# Patient Record
Sex: Male | Born: 1995 | Race: White | Hispanic: Yes | Smoking: Never smoker
Health system: Southern US, Community
[De-identification: ages and names within clinical notes are randomized; demographics above are authoritative.]

## PROBLEM LIST (undated history)

## (undated) DIAGNOSIS — F809 Developmental disorder of speech and language, unspecified: Secondary | ICD-10-CM

## (undated) DIAGNOSIS — I861 Scrotal varices: Secondary | ICD-10-CM

## (undated) DIAGNOSIS — K029 Dental caries, unspecified: Secondary | ICD-10-CM

## (undated) HISTORY — DX: Dental caries, unspecified: K02.9

## (undated) HISTORY — DX: Developmental disorder of speech and language, unspecified: F80.9

## (undated) HISTORY — DX: Scrotal varices: I86.1

---
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
## 2016-01-11 ENCOUNTER — Emergency Department (HOSPITAL_COMMUNITY)
Admission: EM | Admit: 2016-01-11 | Discharge: 2016-01-11 | Disposition: A | Discharge status: Home / Self Care | Payer: No Typology Code available for payment source | Attending: Emergency Medicine | Admitting: Emergency Medicine

## 2016-01-11 ENCOUNTER — Encounter (HOSPITAL_COMMUNITY): Payer: Self-pay

## 2016-01-11 ENCOUNTER — Emergency Department (HOSPITAL_COMMUNITY): Payer: No Typology Code available for payment source

## 2016-01-11 DIAGNOSIS — Z23 Encounter for immunization: Secondary | ICD-10-CM | POA: Diagnosis not present

## 2016-01-11 DIAGNOSIS — M25512 Pain in left shoulder: Secondary | ICD-10-CM | POA: Diagnosis not present

## 2016-01-11 DIAGNOSIS — S0001XA Abrasion of scalp, initial encounter: Secondary | ICD-10-CM | POA: Diagnosis present

## 2016-01-11 DIAGNOSIS — M25552 Pain in left hip: Secondary | ICD-10-CM | POA: Insufficient documentation

## 2016-01-11 DIAGNOSIS — Y999 Unspecified external cause status: Secondary | ICD-10-CM | POA: Diagnosis not present

## 2016-01-11 DIAGNOSIS — Y939 Activity, unspecified: Secondary | ICD-10-CM | POA: Diagnosis not present

## 2016-01-11 DIAGNOSIS — Y92410 Unspecified street and highway as the place of occurrence of the external cause: Secondary | ICD-10-CM | POA: Insufficient documentation

## 2016-01-11 MED ORDER — TETANUS-DIPHTH-ACELL PERTUSSIS 5-2.5-18.5 LF-MCG/0.5 IM SUSP
0.5000 mL | Freq: Once | INTRAMUSCULAR | Status: AC
  Administered 2016-01-11: 0.5 mL via INTRAMUSCULAR
  Filled 2016-01-11: qty 0.5

## 2016-01-11 MED ORDER — METHOCARBAMOL 500 MG PO TABS: 500.0000 mg | ORAL_TABLET | Freq: Two times a day (BID) | ORAL | 0 refills | Status: AC

## 2016-01-11 MED ORDER — NAPROXEN 500 MG PO TABS: 500.0000 mg | ORAL_TABLET | Freq: Two times a day (BID) | ORAL | 0 refills | Status: AC

## 2016-01-11 NOTE — Discharge Instructions (Signed)
Take the prescribed medication as directed. °Follow-up with your primary care doctor. °Return to the ED for new or worsening symptoms. °

## 2016-01-11 NOTE — ED Notes (Signed)
PA Lisa at the bedside.  

## 2016-01-11 NOTE — ED Notes (Signed)
Pt returned from radiology and placed back on monitor
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
## 2016-01-16 ENCOUNTER — Ambulatory Visit: Payer: Self-pay

## 2016-02-05 IMAGING — MR MR KNEE*L* W/O CM
3 of 6 series · 9 of 40 positions shown · non-contrast
Comparison: None.

CLINICAL DATA: Knee pain, swelling and weakness following
hyperextension injury playing soccer 1 month ago. No previous
relevant surgery. Initial encounter.

EXAM:
MRI OF THE LEFT KNEE WITHOUT CONTRAST
TECHNIQUE: Multiplanar, multisequence MR imaging of the knee was performed. No
intravenous contrast was administered.

[Series 5: T2 fat-sat · coronal · 4.0mm · 0.21mm/px · 3 of 25 slices shown]
[im 4/25]
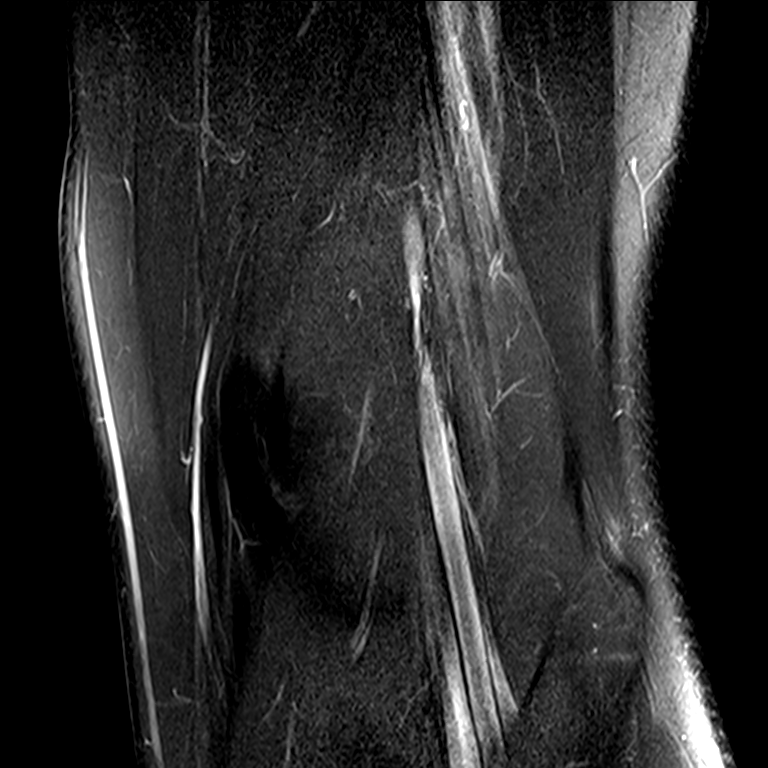
[im 14/25]
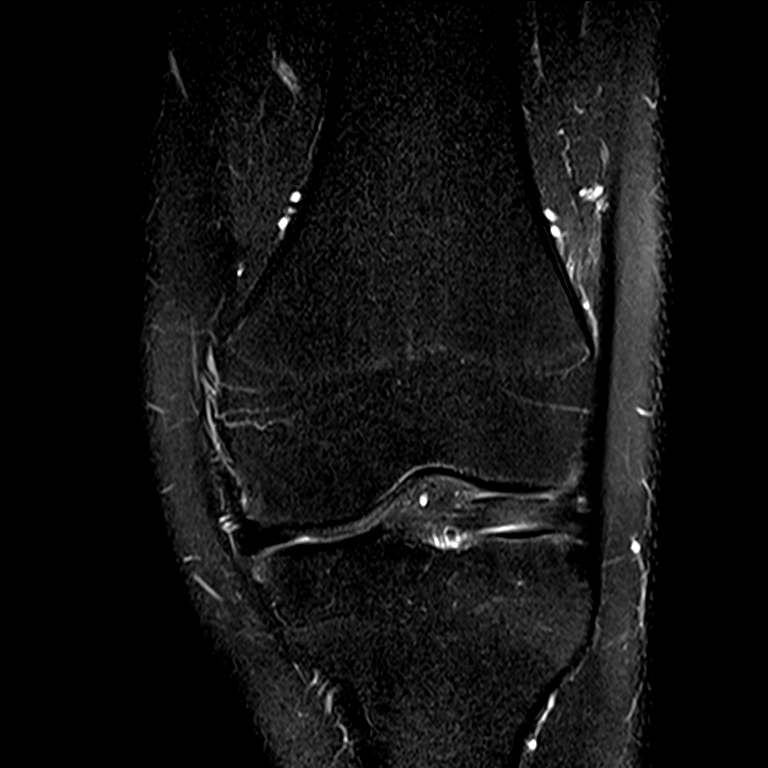
[im 21/25]
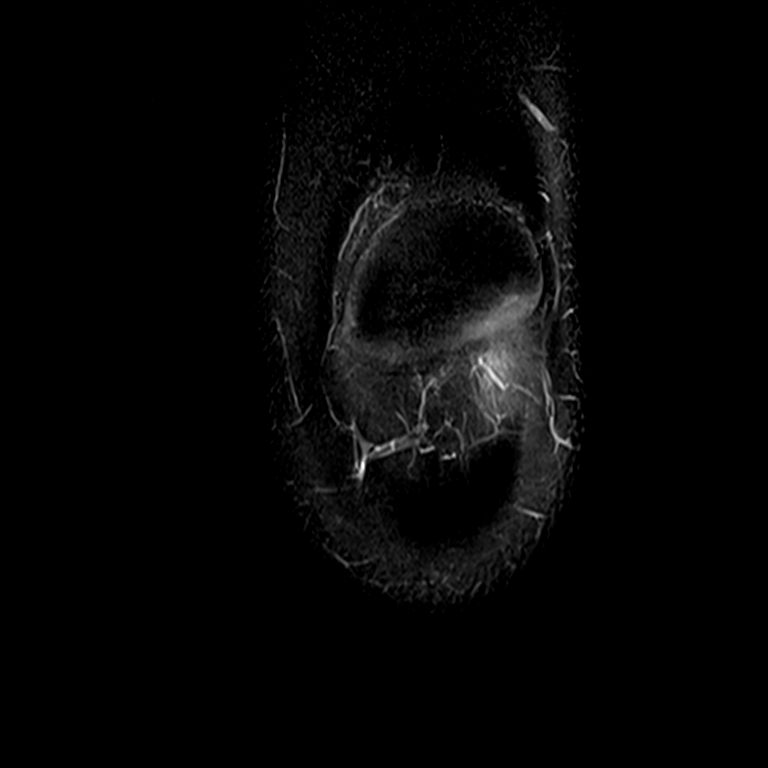

[Series 6: T1 · coronal · 4.0mm · 0.21mm/px · 3 of 23 slices shown]
[im 4/23]
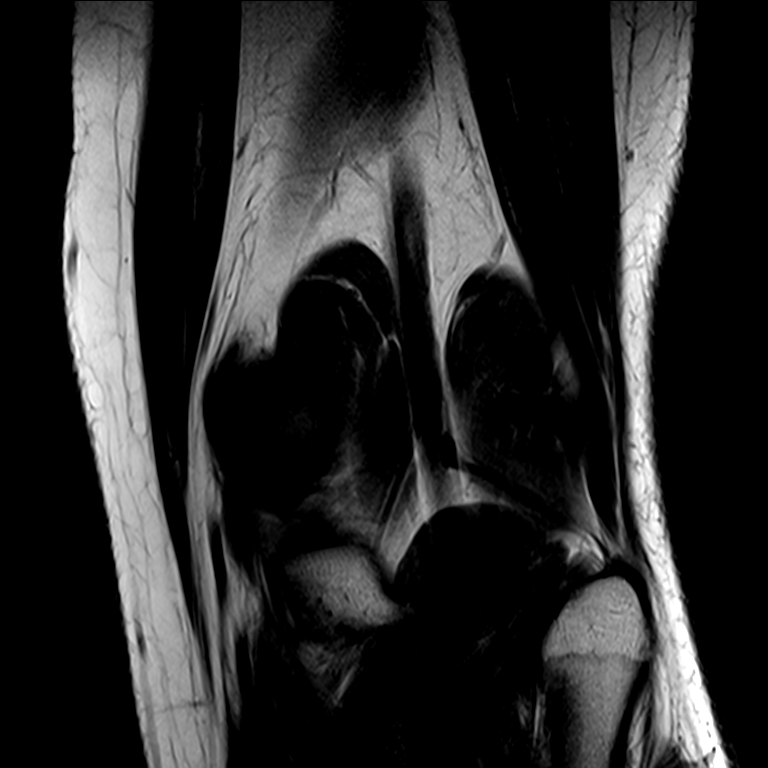
[im 12/23]
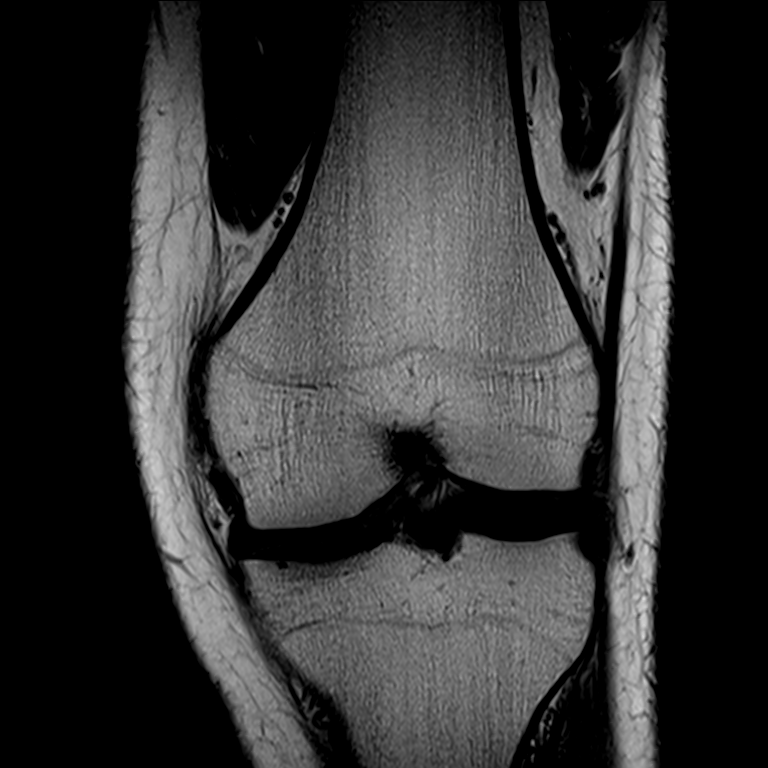
[im 19/23]
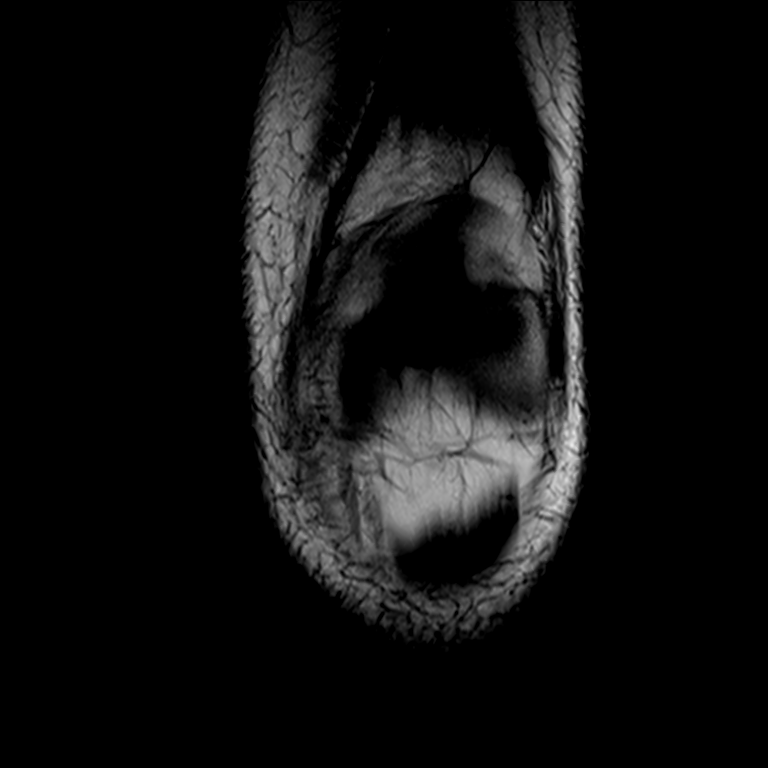

[Series 7: PD fat-sat · sagittal · 4.0mm · 0.21mm/px · 3 of 23 slices shown]
[im 4/23]
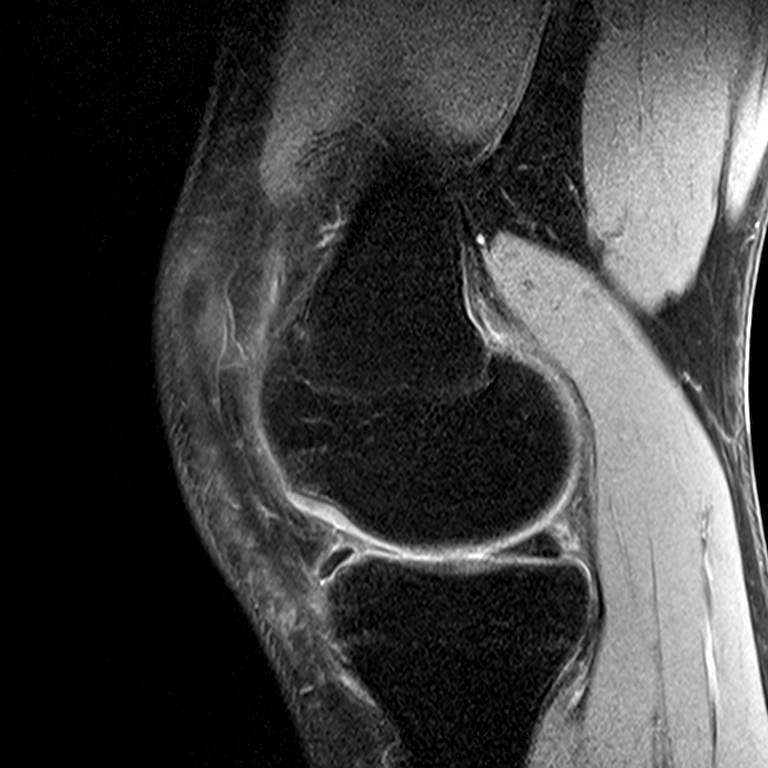
[im 12/23]
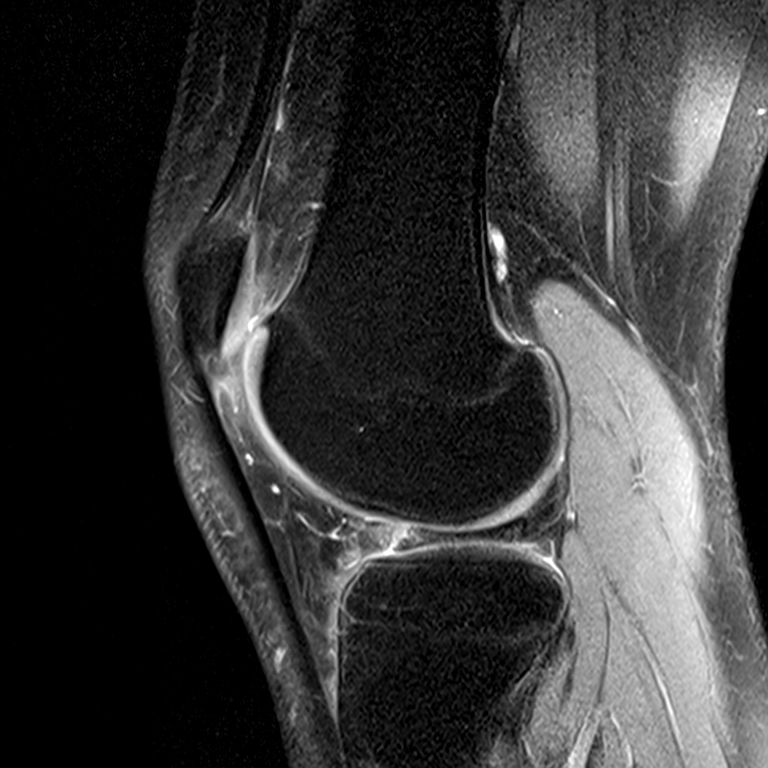
[im 19/23]
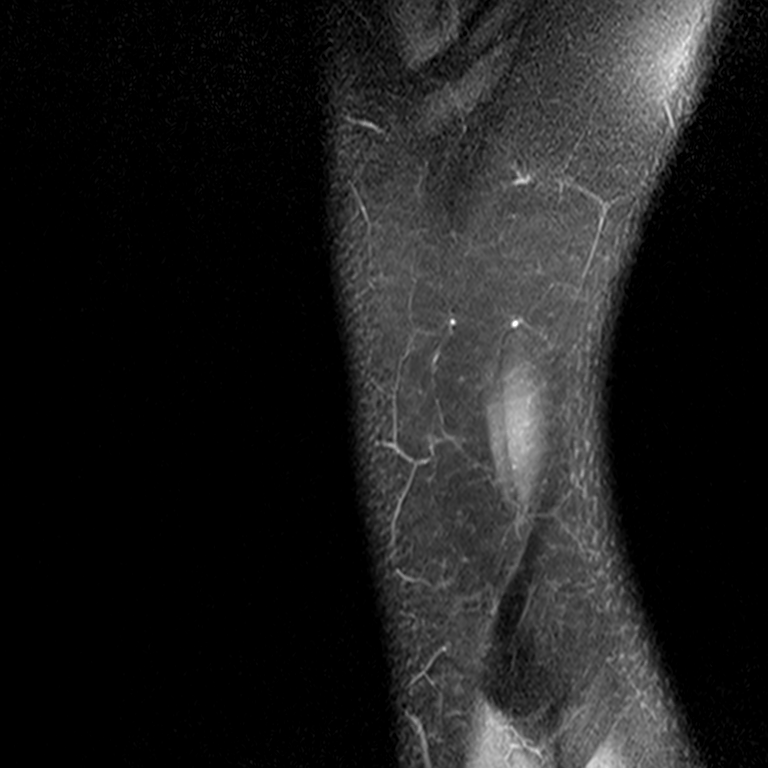

[9 of 40 positions shown; findings below may reference images not displayed]

FINDINGS: MENISCI

Medial meniscus:  Intact with normal morphology.

Lateral meniscus: Complete discoid configuration without evidence of
tear.

LIGAMENTS

Cruciates:  Intact.

Collaterals:  Intact.

CARTILAGE

Patellofemoral:  Preserved.

Medial:  Preserved.

Lateral:  Preserved.

OTHER

Joint:  No significant joint effusion.

Popliteal Fossa:  Unremarkable. No significant Baker's cyst.

Extensor Mechanism: Intact. There is mild proximal patellar
tendinosis with reactive edema in the lower pole of the patella.
There is a moderate patella alta with mild edema superolaterally in
Hoffa's fat. The patellar retinacula appear normal. No signs of
transient patellar dislocation injury.

Bones:  No acute or significant extra-articular osseous findings.
IMPRESSION: 1. Discoid lateral meniscus without evidence of tear. The medial
meniscus appears normal.
2. No acute ligamentous or osseous findings.
3. Patella alta with proximal patellar tendinosis and mild edema
superolaterally in Hoffa's fat.

## 2016-05-07 ENCOUNTER — Ambulatory Visit (INDEPENDENT_AMBULATORY_CARE_PROVIDER_SITE_OTHER): Payer: Self-pay | Admitting: *Deleted

## 2016-05-07 DIAGNOSIS — Z23 Encounter for immunization: Secondary | ICD-10-CM

## 2016-08-04 ENCOUNTER — Ambulatory Visit: Payer: Self-pay

## 2016-08-13 ENCOUNTER — Ambulatory Visit: Payer: Self-pay

## 2016-09-15 ENCOUNTER — Encounter: Payer: Self-pay | Admitting: *Deleted

## 2016-09-15 ENCOUNTER — Ambulatory Visit (INDEPENDENT_AMBULATORY_CARE_PROVIDER_SITE_OTHER): Payer: Self-pay | Admitting: *Deleted

## 2016-09-15 VITALS — BP 112/64 | Ht 70.0 in | Wt 176.6 lb

## 2016-09-15 DIAGNOSIS — Z0000 Encounter for general adult medical examination without abnormal findings: Secondary | ICD-10-CM

## 2016-09-15 DIAGNOSIS — Z113 Encounter for screening for infections with a predominantly sexual mode of transmission: Secondary | ICD-10-CM

## 2016-09-15 NOTE — Patient Instructions (Addendum)
Preventive Care for Young Adults, Male The transition to life after high school as a young adult can be a stressful time with many changes. You may start seeing a primary care physician instead of a pediatrician. This is the time when your health care becomes your responsibility. Preventive care refers to lifestyle choices and visits with your health care provider that can promote health and wellness. What does preventive care include?  A yearly physical exam. This is also called an annual wellness visit.  Dental exams once or twice a year.  Routine eye exams. Ask your health care provider how often you should have your eyes checked.  Personal lifestyle choices, including:  Daily care of your teeth and gums.  Regular physical activity.  Eating a healthy diet.  Avoiding tobacco and drug use.  Avoiding or limiting alcohol use.  Practicing safe sex. What happens during an annual wellness visit? Preventive care starts with a yearly visit to your primary care physician. The services and screenings done by your health care provider during your annual wellness visit will depend on your overall health, lifestyle risk factors, and family history of disease. Counseling  Your health care provider may ask you questions about:  Past medical problems and your family's medical history.  Medicines or supplements that you take.  Health insurance and access to health care.  Alcohol, tobacco, and drug use, including use of any bodybuilding drugs (anabolic steroids).  Your safety at home, work, or school.  Access to firearms.  Emotional well-being and how you cope with stress.  Relationship well-being.  Diet, exercise, and sleep habits.  Your sexual health and activity. Screening  You may have the following tests or measurements:  Height, weight, and BMI.  Blood pressure.  Lipid and cholesterol levels.  Tuberculosis skin test.  Skin exam.  Vision and hearing tests.  Genital  exam to check for testicular cancer or hernias.  Screening test for hepatitis.  Screening tests for STDs (sexually transmitted diseases), if you are at risk. Vaccines  Your health care provider may recommend certain vaccines, such as:  Influenza vaccine. This is recommended every year.  Tetanus, diphtheria, and acellular pertussis (Tdap, Td) vaccine. You may need a Td booster every 10 years.  Varicella vaccine. You may need this if you have not been vaccinated.  HPV vaccine. If you are 26 or younger, you may need three doses over 6 months.  Measles, mumps, and rubella (MMR) vaccine. You may need at least one dose of MMR. You may also need a second dose.  Pneumococcal 13-valent conjugate (PCV13) vaccine. You may need this if you have certain conditions and have not been vaccinated.  Pneumococcal polysaccharide (PPSV23) vaccine. You may need one or two doses if you smoke cigarettes or if you have certain conditions.  Meningococcal vaccine. One dose is recommended if you are age 19-21 years and a first-year college student living in a residence hall, or if you have one of several medical conditions. You may also need additional booster doses.  Hepatitis A vaccine. You may need this if you have certain conditions or if you travel or work in places where you may be exposed to hepatitis A.  Hepatitis B vaccine. You may need this if you have certain conditions or if you travel or work in places where you may be exposed to hepatitis B.  Haemophilus influenzae type b (Hib) vaccine. You may need this if you have certain risk factors. Talk to your health care provider about which   screenings and vaccines you need and how often you need them. What steps can I take to develop healthy behaviors?  Have regular preventive health care visits with your primary care physician and dentist.  Eat a healthy diet.  Drink enough fluid to keep your urine clear or pale yellow.  Stay active. Exercise at  least 30 minutes 5 or more days of the week.  Use alcohol responsibly.  Maintain a healthy weight.  Do not use any products that contain nicotine, such as cigarettes, chewing tobacco, and e-cigarettes. If you need help quitting, ask your health care provider.  Do not use drugs.  Practice safe sex. This includes using condoms to prevent STDs or an unwanted pregnancy.  Find healthy ways to manage stress. How can I protect myself from injury? Injuries from violence or accidents are the leading cause of death among young adults and can often be prevented. Take these steps to help protect yourself:  Always wear your seat belt while driving or riding in a vehicle.  Do not drive if you have been drinking alcohol. Do not ride with someone who has been drinking.  Do not drive when you are tired or distracted. Do not text while driving.  Wear a helmet and other protective equipment during sports activities.  If you have firearms in your house, make sure you follow all gun safety procedures.  Seek help if you have been bullied, physically abused, or sexually abused.  Avoid fighting.  Use the Internet responsibly to avoid dangers such as online bullying. What can I do to cope with stress? Young adults may face many new challenges that can be stressful, such as finding a job, going to college, moving away from home, managing money, being in a relationship, getting married, and having children. To manage stress:  Avoid known stressful situations when you can.  Exercise regularly.  Find a stress-reducing activity that works best for you. Examples include meditation, yoga, listening to music, or reading.  Spend time in nature.  Keep a journal to write about your stress and how you respond.  Talk to your health care provider about stress. He or she may suggest counseling.  Spend time with supportive friends or family.  Do not cope with stress by:  Drinking alcohol or using  drugs.  Smoking cigarettes.  Eating. Where can I get more information? Learn more about preventive care and healthy habits from:  U.S. Preventive Services Task Force: www.uspreventiveservicestaskforce.org/Tools/ConsumerInfo/Index/information-for-consumers  National Adolescent and Young Adult Health Information Center: http://nahic.ucsf.edu/resource-center/  American Academy of Pediatrics Bright Futures: https://brightfutures.aap.org  Society for Adolescent Health and Medicine: www.adolescenthealth.org/Resources/Clinical-Care-Resources/Mental-Health/Mental-Health-Resources-For-Adolesc.aspx  HealthCare.gov: www.healthcare.gov/young-adults/coverage/ This information is not intended to replace advice given to you by your health care provider. Make sure you discuss any questions you have with your health care provider. Document Released: 10/03/2015 Document Revised: 10/24/2015 Document Reviewed: 10/03/2015 Elsevier Interactive Patient Education  2017 [employer]  

## 2016-09-15 NOTE — Progress Notes (Signed)
Adolescent Well Care Visit Henry Bell is a 21 y.o. male who is here for well care.    PCP:  Theadore Nan, MD   History was provided by the patient.  Confidentiality was discussed with the patient and, if applicable, with caregiver as well. Patient's personal or confidential phone number: [phone]   Current Issues: Current concerns include: No concerns today. Knee pain improved, but has not resumed soccer due to prior injury (and not as interested).   Nutrition: Nutrition/Eating Behaviors: Eats more at home. Mom cooks most meals. Drinking water, milk. Drinking some juice, some soda.  Adequate calcium in diet?: yes  Exercise/ Media: Play any Sports?/ Exercise: Walking and running daily.  Screen Time:  < 2 hours Media Rules or Monitoring?: yes  Sleep:  Sleep: Bed at 11, wakes at 7:30-8.   Social Screening/ Education: Lives with:  At home with mother, father, 2 siblings (12, 3).  Parental relations:  good Activities, Work, and Regulatory affairs officer?: Employed as Curator, responsible for cleaning room. Started working on the weekends initially at 14. Dad also works as at [employer].  Finished HS in December. Interested in college. Applied to [employer], [employer]. Encouraged to contact schools if difficulty with matriculation.   Confidential Social History: Tobacco?  no Secondhand smoke exposure?  no Drugs/ETOH?  no  Recently broke up with significant other. Sexually Active?  no   Pregnancy Prevention: abstinence   Safe at home, in school & in relationships?  Yes Safe to self?  Yes   Screenings: Patient has a dental home: yes  The patient completed the Rapid Assessment for Adolescent Preventive Services screening questionnaire and the following topics were identified as risk factors and discussed: healthy eating and exercise  In addition, the following topics were discussed as part of anticipatory guidance healthy eating, exercise, seatbelt use, tobacco use, marijuana use,  drug use, condom use, family problems and screen time.  PHQ-9 completed and results indicated No concerns- score 1.   Physical Exam:  Vitals:   09/15/16 1127  BP: 112/64  Weight: 176 lb 9.6 oz (80.1 kg)  Height:  (1.778 m)   BP 112/64   Ht  (1.778 m)   Wt 176 lb 9.6 oz (80.1 kg)   BMI 25.34 kg/m  Body mass index: body mass index is 25.34 kg/m. Blood pressure percentiles are 16 % systolic and 11 % diastolic based on NHBPEP's 4th Report. Blood pressure percentile targets: 90: 136/93, 95: 140/97, 99 + 5 mmHg: 153/110.   Hearing Screening   Method: Audiometry             Right ear:   Left ear:   Visual Acuity Screening   Right eye Left eye Both eyes  Without correction:     With correction:    General Appearance:   alert, oriented, no acute distress and well nourished. Wearing glasses. Conversational throughout examination.   HENT: Normocephalic, no obvious abnormality, conjunctiva clear  Mouth:   Normal appearing teeth, no obvious discoloration, dental caries, or dental caps  Neck:   Supple; thyroid: no enlargement, symmetric, no tenderness/mass/nodules  Lungs:   Clear to auscultation bilaterally, normal work of breathing  Heart:   Regular rate and rhythm, S1 and S2 normal, no murmurs;   Abdomen:   Soft, non-tender, no mass, or organomegaly  GU normal male genitals, no testicular masses or hernia  Musculoskeletal:   Tone and strength strong and symmetrical, all extremities               Lymphatic:   No cervical adenopathy  Skin/Hair/Nails:   Skin warm, dry and intact, no rashes, no bruises or petechiae  Neurologic:   Strength, gait, and coordination normal and age-appropriate     Assessment and Plan:  1. Encounter for general adult medical examination without abnormal findings  BMI is appropriate for age. Counseled re: healthy diet, exercise. Encouraged to  decrease juice and soda.  Hearing screening result:normal Vision screening result: normal   2. Routine screening for STI (sexually transmitted infection) Counseled re: safe sexual practices. Urine studies pending. Will follow up results.  - GC/Chlamydia Probe Amp Return in 1 year (on 09/15/2017).  Elige Radon, MD [facility] Pediatric Primary Care PGY-3 09/15/2016

## 2016-09-16 LAB — GC/CHLAMYDIA PROBE AMP
CT PROBE, AMP APTIMA: NOT DETECTED
GC Probe RNA: NOT DETECTED

## 2016-09-17 ENCOUNTER — Other Ambulatory Visit: Payer: Self-pay

## 2016-09-17 ENCOUNTER — Ambulatory Visit (INDEPENDENT_AMBULATORY_CARE_PROVIDER_SITE_OTHER): Payer: Self-pay

## 2016-09-17 ENCOUNTER — Other Ambulatory Visit: Payer: Self-pay | Admitting: Pediatrics

## 2016-09-17 DIAGNOSIS — Z13228 Encounter for screening for other metabolic disorders: Secondary | ICD-10-CM

## 2016-09-17 DIAGNOSIS — Z0000 Encounter for general adult medical examination without abnormal findings: Secondary | ICD-10-CM

## 2016-09-17 LAB — LIPID PANEL
CHOL/HDL RATIO: 2.3 ratio (ref <5.0)
Cholesterol: 97 mg/dL (ref <170)
HDL: 43 mg/dL — AB (ref >45)
LDL CALC: 42 mg/dL (ref <110)
Triglycerides: 61 mg/dL (ref <90)
VLDL: 12 mg/dL (ref <30)

## 2016-09-17 NOTE — Progress Notes (Unsigned)
Patient came in for non fasting labs.. Successful collection.

## 2016-09-18 LAB — VITAMIN D 25 HYDROXY (VIT D DEFICIENCY, FRACTURES): VIT D 25 HYDROXY: 35 ng/mL (ref 30–100)

## 2016-09-18 NOTE — Progress Notes (Signed)
Aided with spanish interpreter, Called patient and reported lab results.

## 2018-08-09 ENCOUNTER — Ambulatory Visit: Payer: Self-pay | Admitting: Pediatrics

## 2018-08-09 ENCOUNTER — Encounter: Payer: Self-pay | Admitting: Licensed Clinical Social Worker
